# Patient Record
Sex: Male | Born: 1970 | Race: Black or African American | Hispanic: No | Marital: Single | Smoking: Never smoker
Health system: Southern US, Community
[De-identification: ages and names within clinical notes are randomized; demographics above are authoritative.]

---

## 2000-04-07 ENCOUNTER — Emergency Department (HOSPITAL_COMMUNITY): Admission: EM | Admit: 2000-04-07 | Discharge: 2000-04-07 | Payer: Self-pay | Admitting: Emergency Medicine

## 2000-08-29 ENCOUNTER — Emergency Department (HOSPITAL_COMMUNITY): Admission: EM | Admit: 2000-08-29 | Discharge: 2000-08-29 | Payer: Self-pay | Admitting: Emergency Medicine

## 2000-08-29 ENCOUNTER — Encounter: Payer: Self-pay | Admitting: Emergency Medicine

## 2004-06-01 ENCOUNTER — Emergency Department (HOSPITAL_COMMUNITY): Admission: EM | Admit: 2004-06-01 | Discharge: 2004-06-01 | Payer: Self-pay | Admitting: Emergency Medicine

## 2004-06-02 ENCOUNTER — Emergency Department (HOSPITAL_COMMUNITY): Admission: EM | Admit: 2004-06-02 | Discharge: 2004-06-02 | Payer: Self-pay | Admitting: Emergency Medicine

## 2004-06-04 ENCOUNTER — Emergency Department (HOSPITAL_COMMUNITY): Admission: EM | Admit: 2004-06-04 | Discharge: 2004-06-04 | Payer: Self-pay | Admitting: Emergency Medicine

## 2004-06-19 ENCOUNTER — Emergency Department (HOSPITAL_COMMUNITY): Admission: EM | Admit: 2004-06-19 | Discharge: 2004-06-19 | Payer: Self-pay | Admitting: Emergency Medicine

## 2004-06-27 ENCOUNTER — Emergency Department (HOSPITAL_COMMUNITY): Admission: EM | Admit: 2004-06-27 | Discharge: 2004-06-27 | Payer: Self-pay | Admitting: *Deleted

## 2004-07-15 ENCOUNTER — Emergency Department (HOSPITAL_COMMUNITY): Admission: EM | Admit: 2004-07-15 | Discharge: 2004-07-15 | Payer: Self-pay

## 2005-02-22 ENCOUNTER — Emergency Department (HOSPITAL_COMMUNITY): Admission: EM | Admit: 2005-02-22 | Discharge: 2005-02-23 | Payer: Self-pay | Admitting: Emergency Medicine

## 2005-03-06 ENCOUNTER — Emergency Department (HOSPITAL_COMMUNITY): Admission: EM | Admit: 2005-03-06 | Discharge: 2005-03-06 | Payer: Self-pay | Admitting: Emergency Medicine

## 2006-01-31 ENCOUNTER — Emergency Department (HOSPITAL_COMMUNITY): Admission: EM | Admit: 2006-01-31 | Discharge: 2006-01-31 | Payer: Self-pay | Admitting: Emergency Medicine

## 2006-06-21 IMAGING — CR DG CHEST 2V
2 series · 2 of 2 positions shown · non-contrast
Comparison: none

CLINICAL DATA: 33 year old with heart palpitations and weakness.
 TWO VIEW CHEST   - 07/15/04
 Two views of the chest without prior studies for comparison demonstrate the cardiac silhouette, mediastinal and hilar contours to be within normal limits.  The lungs are clear.  The bony structures are intact.
 IMPRESSION
 1.  No acute cardiopulmonary findings.

[view not recorded (1 of 2)]
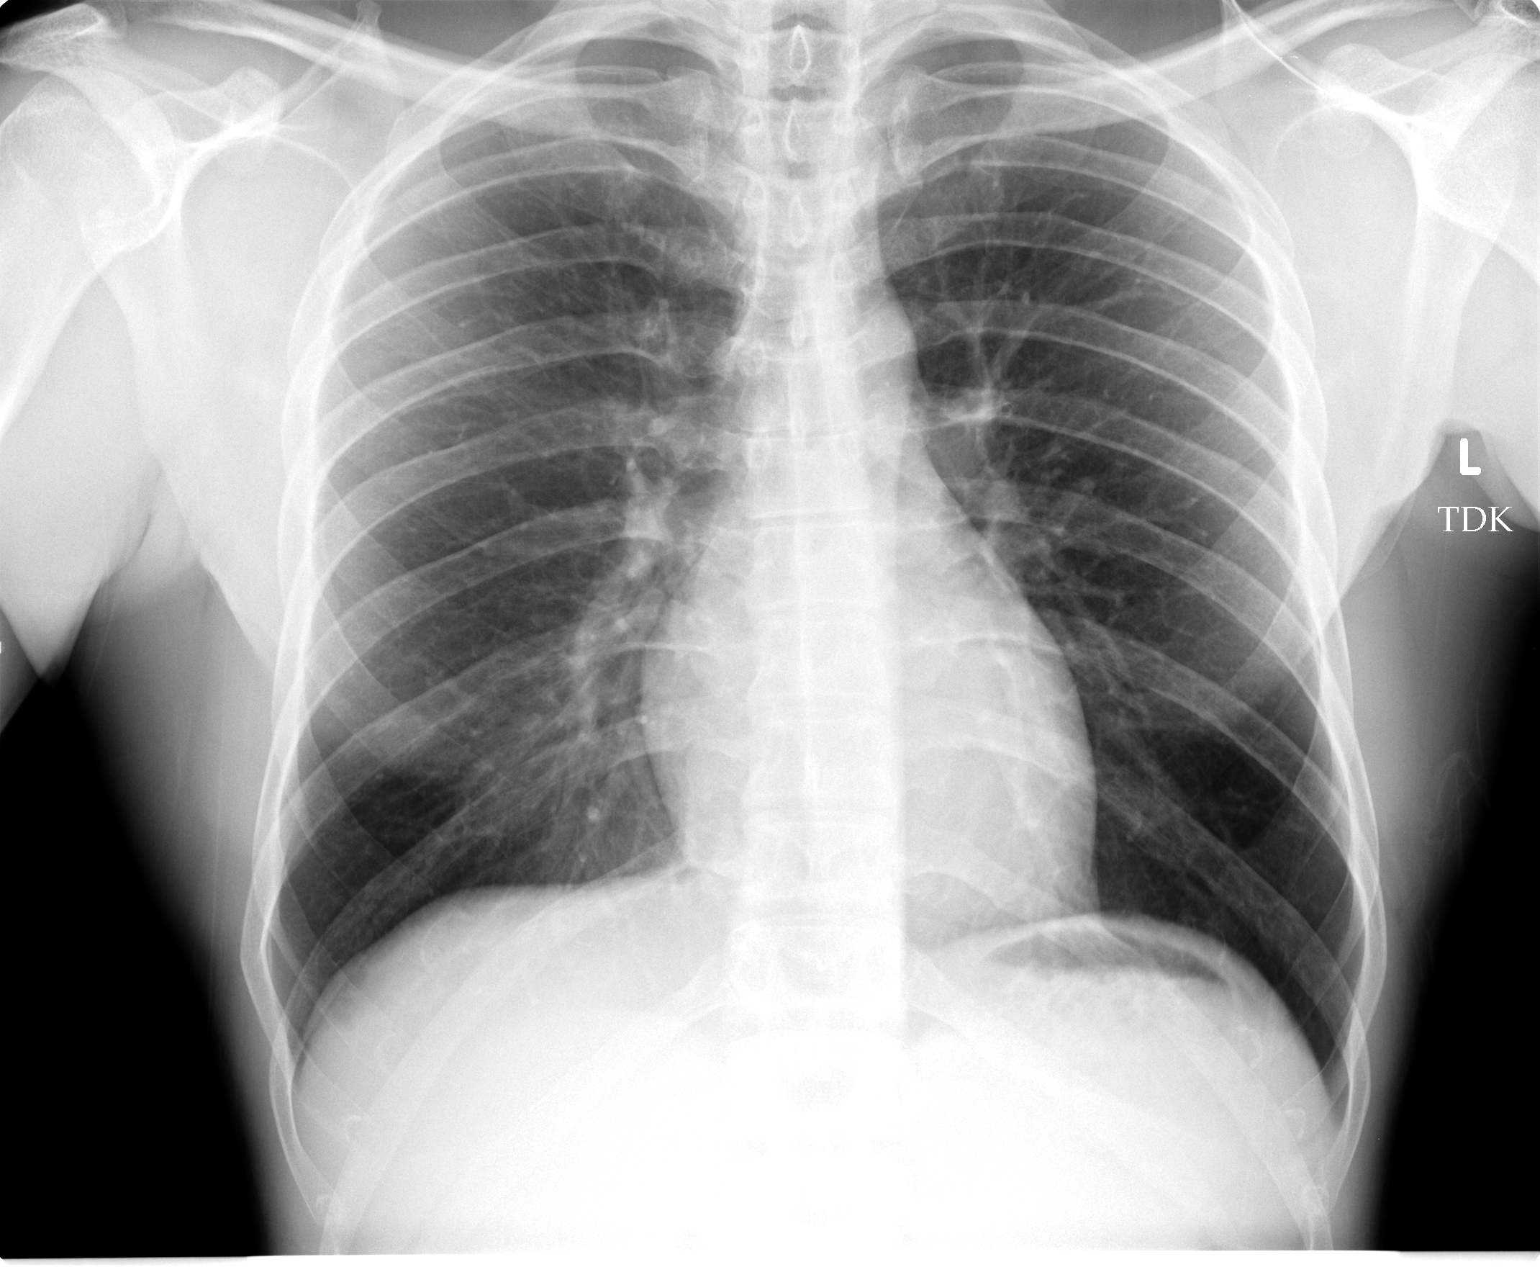

[view not recorded (2 of 2)]
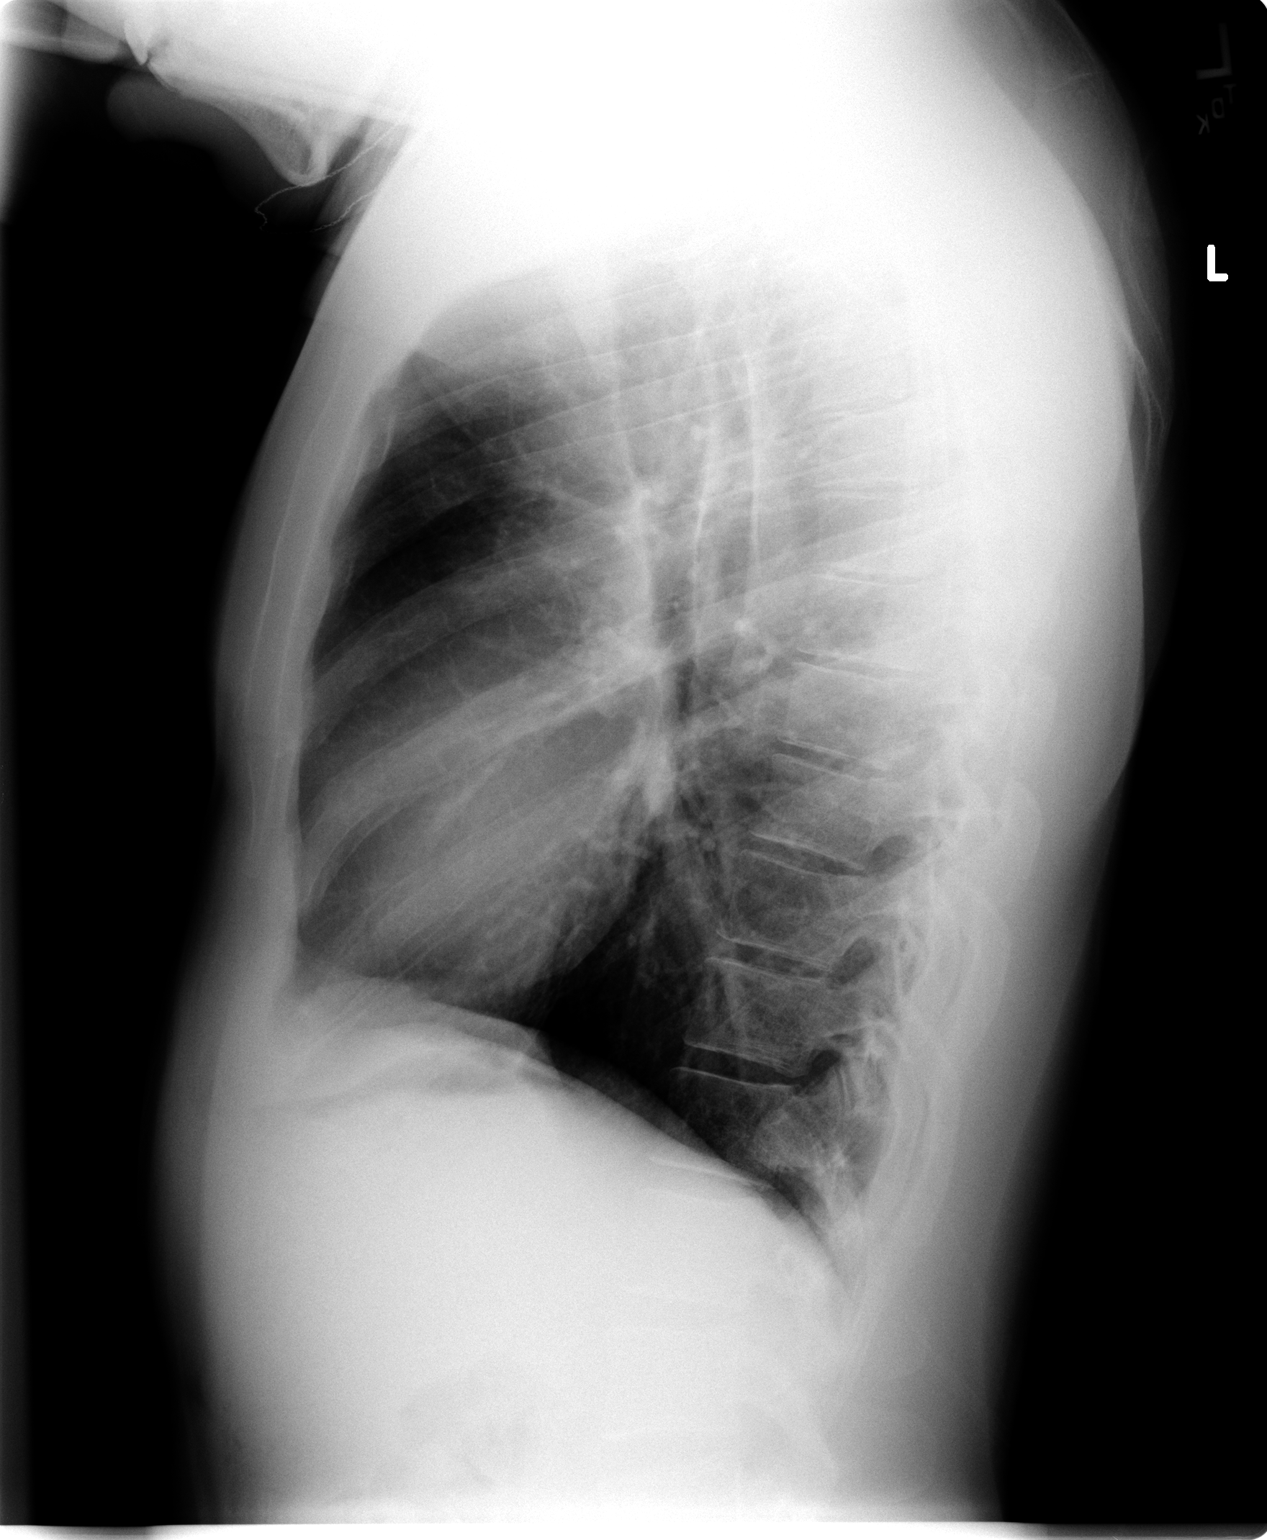

[2 of 2 positions shown; findings below may reference images not displayed]

## 2008-11-25 ENCOUNTER — Emergency Department (HOSPITAL_COMMUNITY): Admission: EM | Admit: 2008-11-25 | Discharge: 2008-11-25 | Payer: Self-pay | Admitting: Emergency Medicine

## 2010-11-01 IMAGING — CT CT HEAD W/O CM
1 series · 16 of 30 positions shown, 20 images · non-contrast
Comparison: None

CLINICAL DATA: Persistent headache for 2 weeks.

CT HEAD WITHOUT CONTRAST
TECHNIQUE: Contiguous axial images were obtained from the base of
the skull through the vertex without contrast.

[Series 2: head_seq 4.5 h37s st · axial · 0.44mm/px · z∈[-180,-36]mm · 16 of 36 slices shown, 20 images]
[im 2/36  brain]
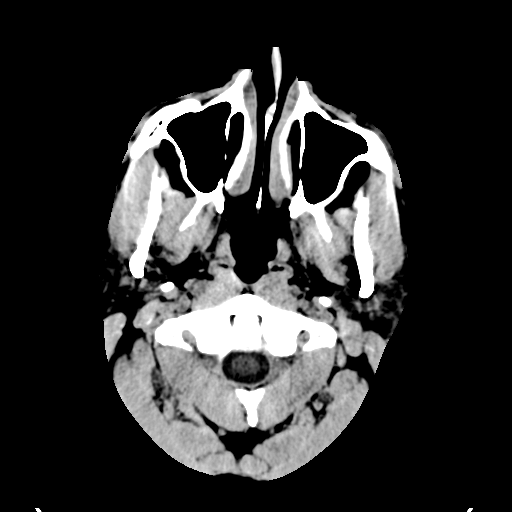
[im 2/36  bone]
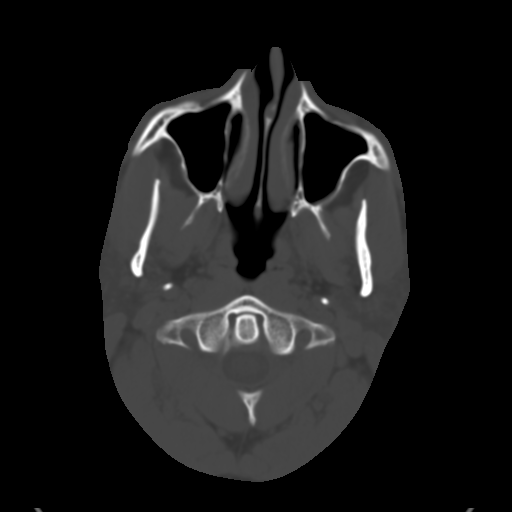
[im 4/36  brain]
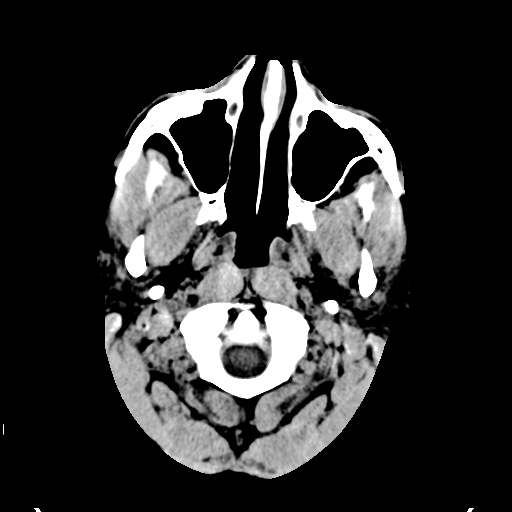
[im 7/36  brain]
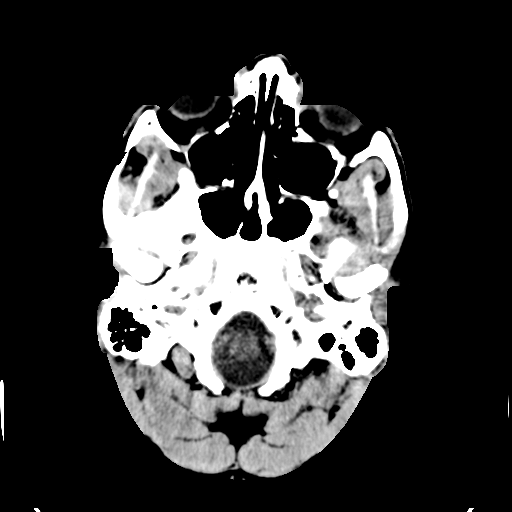
[im 9/36  brain]
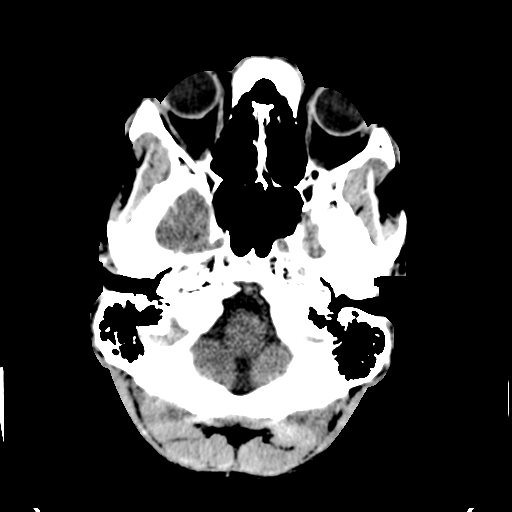
[im 10/36  brain]
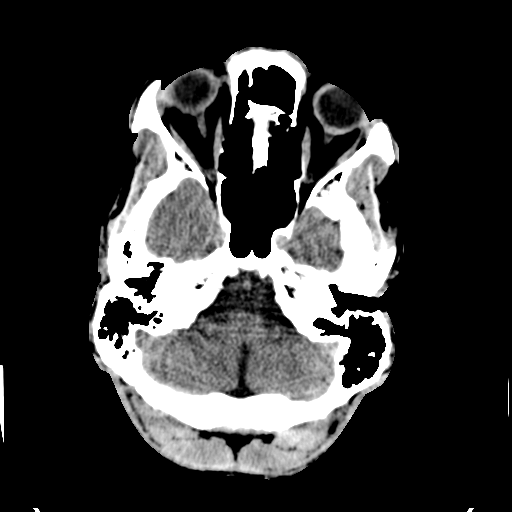
[im 10/36  bone]
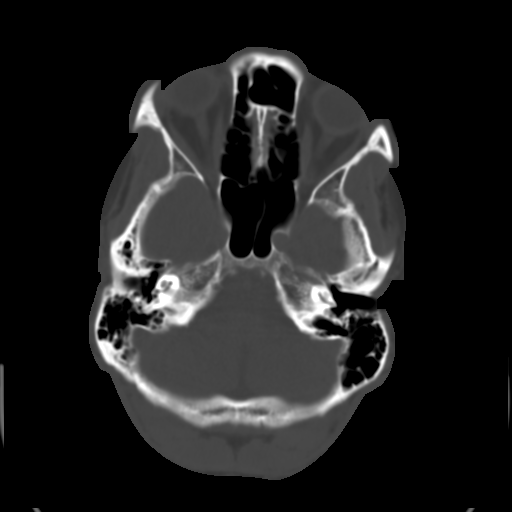
[im 13/36  brain]
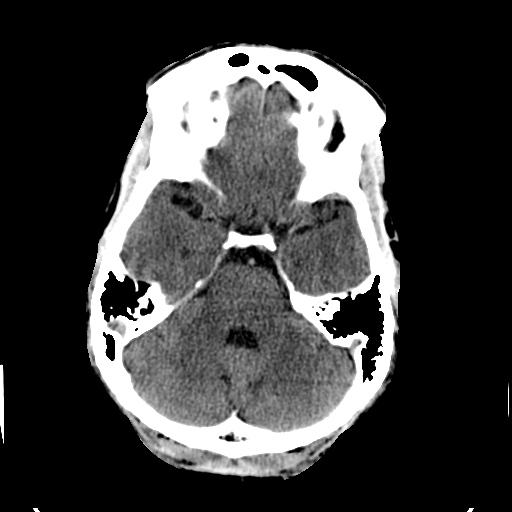
[im 15/36  brain]
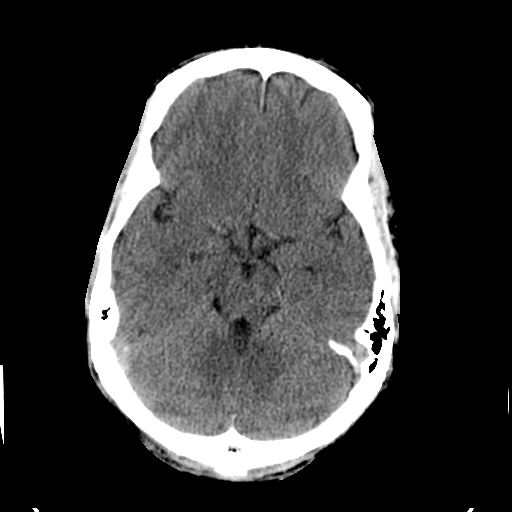
[im 17/36  brain]
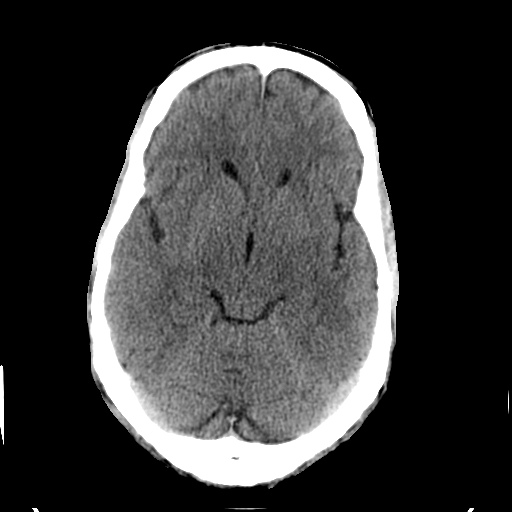
[im 19/36  brain]
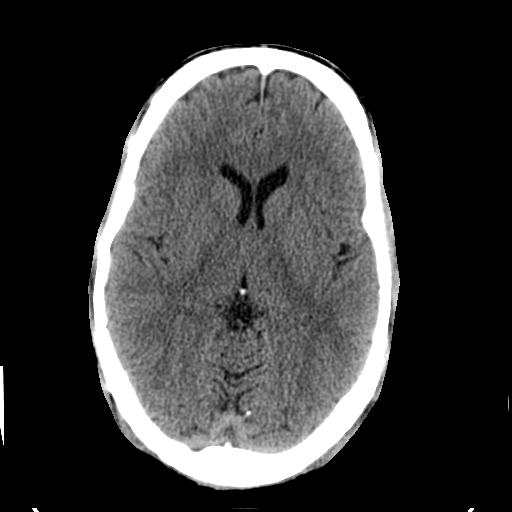
[im 19/36  bone]
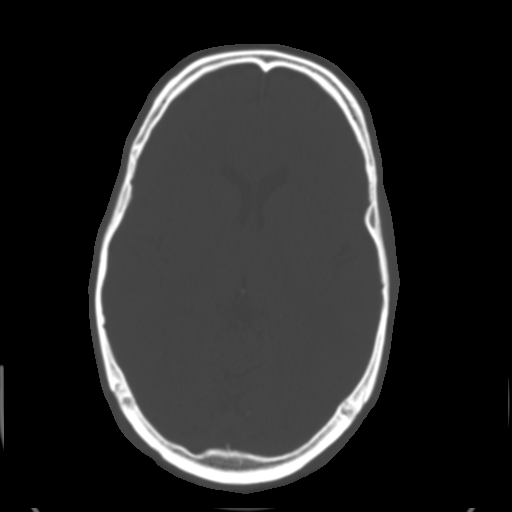
[im 21/36  brain]
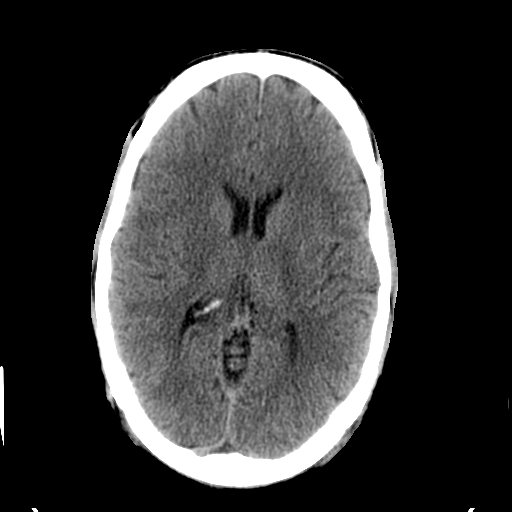
[im 23/36  brain]
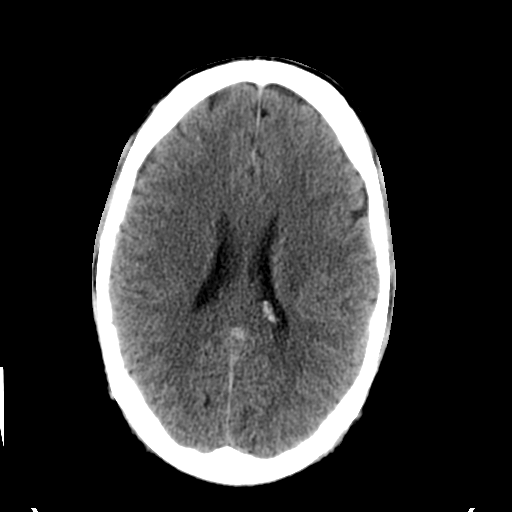
[im 26/36  brain]
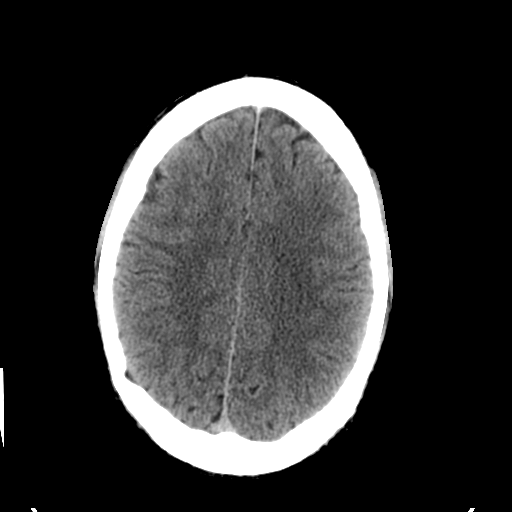
[im 27/36  brain]
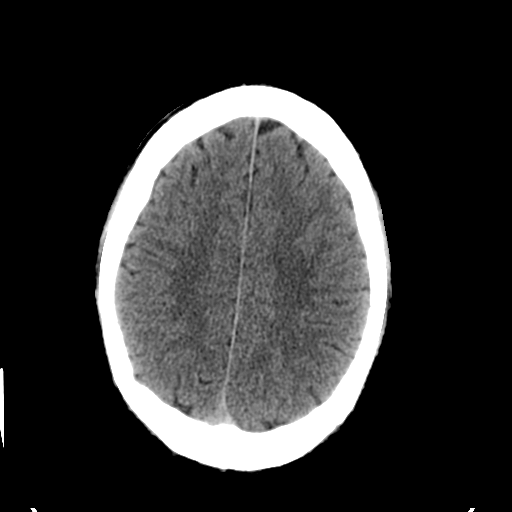
[im 27/36  bone]
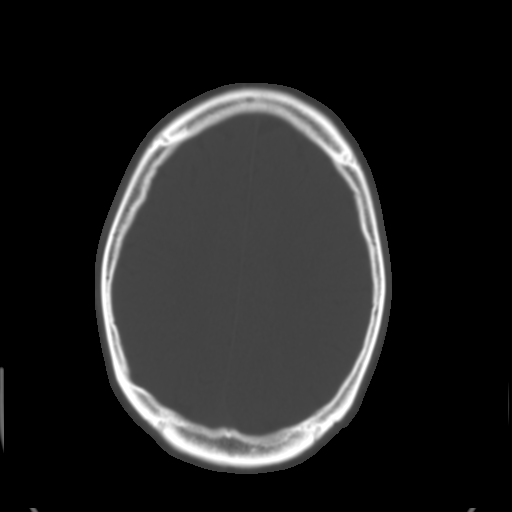
[im 29/36  brain]
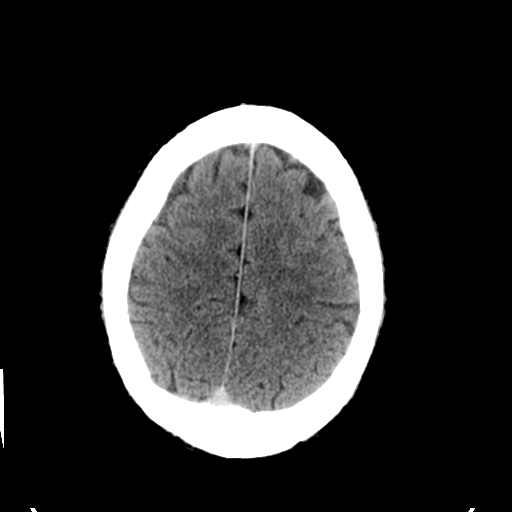
[im 32/36  brain]
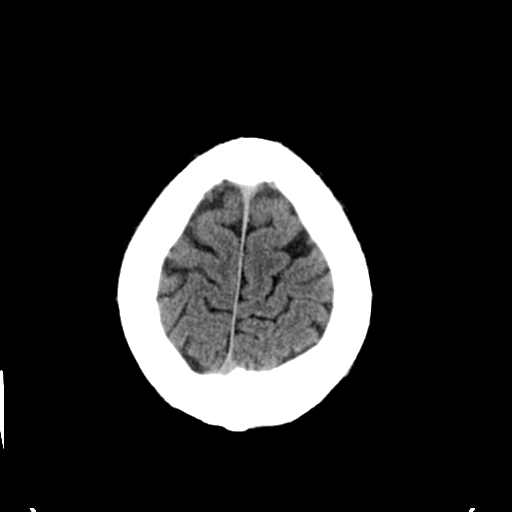
[im 34/36  brain]
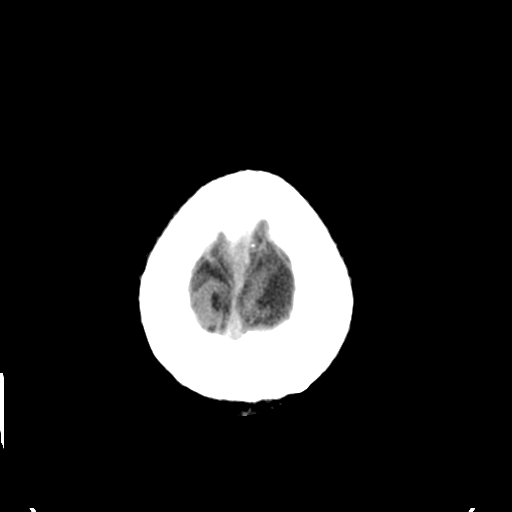

[16 of 30 positions shown; findings below may reference images not displayed]

FINDINGS: There is no acute intracranial hemorrhage, acute
infarction, or intracranial mass lesion.  The ventricles are normal
in size and configuration.  The brain parenchyma is normal.  Bony
structures are normal.
IMPRESSION: Normal CT scan of the head.

## 2023-02-01 ENCOUNTER — Other Ambulatory Visit: Payer: Self-pay

## 2023-02-01 ENCOUNTER — Emergency Department (HOSPITAL_BASED_OUTPATIENT_CLINIC_OR_DEPARTMENT_OTHER)
Admission: EM | Admit: 2023-02-01 | Discharge: 2023-02-01 | Disposition: A | Discharge status: Home / Self Care | Payer: Self-pay | Attending: Emergency Medicine | Admitting: Emergency Medicine

## 2023-02-01 ENCOUNTER — Encounter (HOSPITAL_BASED_OUTPATIENT_CLINIC_OR_DEPARTMENT_OTHER): Payer: Self-pay

## 2023-02-01 DIAGNOSIS — L2489 Irritant contact dermatitis due to other agents: Secondary | ICD-10-CM | POA: Insufficient documentation

## 2023-02-01 DIAGNOSIS — L03114 Cellulitis of left upper limb: Secondary | ICD-10-CM | POA: Insufficient documentation

## 2023-02-01 MED ORDER — DOXYCYCLINE HYCLATE 100 MG PO CAPS: 100.0000 mg | ORAL_CAPSULE | Freq: Two times a day (BID) | ORAL | 0 refills | Status: AC

## 2023-02-01 MED ORDER — TRIAMCINOLONE ACETONIDE 0.1 % EX CREA: 1.0000 | TOPICAL_CREAM | Freq: Two times a day (BID) | CUTANEOUS | 0 refills | Status: AC

## 2023-02-01 NOTE — ED Triage Notes (Addendum)
Patient BIB [facility] from Home.  Endorses Freight forwarder" as a Body Spray to mask the smell of Marijuana. Began to note Rash to areas sprayed with spray over 9 days. Does note some Swelling to the Posterior Portion of his Left Forearm.   VSS with EMS. No Discernable pain. 168/104 BP with EMS. All other VSS. No EMS Interventions.   NAD Note during Triage. A&Ox4. GCS 15. Ambulatory.

## 2023-02-01 NOTE — ED Provider Notes (Signed)
[facility] Provider Note   CSN: [id number] Arrival date & time: 02/01/23  1743     History  Chief Complaint  Patient presents with   Rash    Kevin Stevens is a 52 y.o. male who presents emergency department with concerns for rash onset 9 days.  Notes that he was using a deodorizer spray as topical on his body.  Notes that he began to notice a rash that has been painful and itchy to the area over the past 9 days.  Also notes swelling to his left upper arm.  Denies recent blood draws, IV drug use, insect bite.  No new meds, lotions, detergents, clothing items.  Has tried Zyrtec and Benadryl at home for his symptoms.  Denies fever, nausea, vomiting, trouble breathing, trouble swallowing, sore throat, color change.  The history is provided by the patient. No language interpreter was used.       Home Medications Prior to Admission medications   Medication Sig Start Date End Date Taking? Authorizing Provider  doxycycline (VIBRAMYCIN) 100 MG capsule Take 1 capsule (100 mg total) by mouth 2 (two) times daily for 5 days. 02/01/23 02/06/23 Yes Braxson Hollingsworth A, PA-C  triamcinolone cream (KENALOG) 0.1 % Apply 1 Application topically 2 (two) times daily. 02/01/23  Yes Baley Lorimer A, PA-C      Allergies    Patient has no known allergies.    Review of Systems   Review of Systems  Skin:  Positive for rash.  All other systems reviewed and are negative.   Physical Exam Updated Vital Signs BP (!) 168/119 (BP Location: Right Arm)   Pulse 78   Temp (!) 96.8 F (36 C) (Temporal)   Resp 18   Ht 5\' 10"  (1.778 m)   Wt 76.2 kg   SpO2 100%   BMI 24.11 kg/m  Physical Exam Vitals and nursing note reviewed.  Constitutional:      General: He is not in acute distress.    Appearance: Normal appearance.  Eyes:     General: No scleral icterus.    Extraocular Movements: Extraocular movements intact.  Cardiovascular:     Rate and Rhythm: Normal rate.   Pulmonary:     Effort: Pulmonary effort is normal. No respiratory distress.  Abdominal:     Palpations: Abdomen is soft. There is no mass.     Tenderness: There is no abdominal tenderness.  Musculoskeletal:        General: Normal range of motion.     Cervical back: Neck supple.     Comments: Swelling noted to left forearm and distal aspect of left upper arm with mild increased warmth noted to the area.  Full active range of motion of left olecranon without difficulty.  No tenderness to palpation noted to left olecranon.  No palpable tenderness to palpation noted to left upper extremity.  No overlying skin changes.  Dry patchy rash noted to bilateral antecubital regions, neck, inner thighs.  No appreciable swelling, drainage, erythema or increased warmth noted to the area.  Skin:    General: Skin is warm and dry.     Findings: No rash.  Neurological:     Mental Status: He is alert.     Sensory: Sensation is intact.     Motor: Motor function is intact.  Psychiatric:        Behavior: Behavior normal.     ED Results / Procedures / Treatments   Labs (all labs ordered are  listed, but only abnormal results are displayed) Labs Reviewed - No data to display  EKG None  Radiology No results found.  Procedures Procedures    Medications Ordered in ED Medications - No data to display  ED Course/ Medical Decision Making/ A&P                             Medical Decision Making  Pt presents with concerns for rash onset 9 days. Tried OTC meds. Vital signs, pt afebrile. On exam, pt with Swelling noted to left forearm and distal aspect of left upper arm with mild increased warmth noted to the area.  Full active range of motion of left olecranon without difficulty.  No tenderness to palpation noted to left olecranon.  No palpable tenderness to palpation noted to left upper extremity.  No overlying skin changes.  Dry patchy rash noted to bilateral antecubital regions, neck, inner thighs.  No  appreciable swelling, drainage, erythema or increased warmth noted to the area. No acute cardiovascular, respiratory, exam findings. Differential diagnosis includes cellulitis, abscess, septic arthritis, contact dermatitis.    Disposition: Presentation suspicious for likely contact dermatitis as well as cellulitis.  Doubt concerns at this time for abscess.  Doubt concerns this time for septic arthritis, patient afebrile, patient with full range of motion of left olecranon without assistance or difficulty. After consideration of the diagnostic results and the patients response to treatment, I feel that the patient would benefit from Discharge home.  Patient provided with a prescription for triamcinolone cream as well as doxycycline.  Supportive care measures and strict return precautions discussed with patient at bedside. Pt acknowledges and verbalizes understanding. Pt appears safe for discharge. Follow up as indicated in discharge paperwork.    This chart was dictated using voice recognition software, Dragon. Despite the best efforts of this provider to proofread and correct errors, errors may still occur which can change documentation meaning.   Final Clinical Impression(s) / ED Diagnoses Final diagnoses:  Irritant contact dermatitis due to other agents  Cellulitis of left upper extremity    Rx / DC Orders ED Discharge Orders          Ordered    triamcinolone cream (KENALOG) 0.1 %  2 times daily        02/01/23 1847    doxycycline (VIBRAMYCIN) 100 MG capsule  2 times daily        02/01/23 1847              Makyle Eslick A, PA-C 02/01/23 1852    Gareth Morgan, MD 02/02/23 1143

## 2023-02-01 NOTE — Discharge Instructions (Addendum)
It was a pleasure taking care of you today!   Ensure to keep skin moisturized, you may use over-the-counter Aveeno or Eucerin cream. Use the Triamcinolone cream to the body for no more than 5 days. It is important that you make sure you do not scratch/pick at the area.  You may also take over-the-counter Benadryl as directed for your symptoms.  You may continue with over-the-counter Zyrtec during the day.  You also be sent a prescription for doxycycline to treat for the swelling to your left upper arm.  Attached is information for [facility] to establish care with a primary care provider. Return to the ED if you are experiencing increasing/worsening symptoms.
